# Patient Record
Sex: Male | Born: 2005 | Race: White | Hispanic: No | Marital: Single | Smoking: Never smoker
Health system: Southern US, Community
[De-identification: ages and names within clinical notes are randomized; demographics above are authoritative.]

---
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
## 2023-10-21 ENCOUNTER — Ambulatory Visit
Admission: EM | Admit: 2023-10-21 | Discharge: 2023-10-21 | Disposition: A | Discharge status: Home / Self Care | Source: Ambulatory Visit | Attending: Family Medicine | Admitting: Family Medicine

## 2023-10-21 DIAGNOSIS — H109 Unspecified conjunctivitis: Secondary | ICD-10-CM

## 2023-10-21 MED ORDER — TOBRAMYCIN 0.3 % OP SOLN: 1.0000 [drp] | OPHTHALMIC | 0 refills | Status: AC

## 2023-10-21 NOTE — ED Triage Notes (Signed)
 Patient presents to the office for right eye irritation an redness x 3 days. Denies any other symptoms.

## 2023-10-21 NOTE — ED Provider Notes (Signed)
  [facility]  Note:  This document was prepared using Conservation officer, historic buildings and may include unintentional dictation errors.  MRN: [id number] DOB: 2005-05-25  Subjective:   Christian Barker is a 18 y.o. male presenting for 2-day history of persistent right eye irritation, redness and slight watering.  No trauma, photophobia, loss of vision, vision change, contact lens use, eyelid pain/swelling/redness, fever.  Patient did spend a lot of time out fishing and in the sun over the weekend.  No other known inciting factor.  No current facility-administered medications for this encounter. No current outpatient medications on file.   No Known Allergies  History reviewed. No pertinent past medical history.   History reviewed. No pertinent surgical history.  History reviewed. No pertinent family history.  Social History   Tobacco Use   Smoking status: Never   Smokeless tobacco: Never    ROS   Objective:   Vitals: BP 120/85 (BP Location: Left Arm)   Pulse 63   Temp 97.7 F (36.5 C) (Oral)   Resp 18   SpO2 98%   Physical Exam Constitutional:      General: He is not in acute distress.    Appearance: Normal appearance. He is well-developed and normal weight. He is not ill-appearing, toxic-appearing or diaphoretic.  HENT:     Head: Normocephalic and atraumatic.     Right Ear: External ear normal.     Left Ear: External ear normal.     Nose: Nose normal.     Mouth/Throat:     Pharynx: Oropharynx is clear.  Eyes:     General: Lids are everted, no foreign bodies appreciated. No scleral icterus.       Right eye: No foreign body, discharge or hordeolum.        Left eye: No foreign body, discharge or hordeolum.     Extraocular Movements: Extraocular movements intact.     Conjunctiva/sclera:     Right eye: Right conjunctiva is injected (with slight matting at the base of the eyelashes). No chemosis, exudate or hemorrhage.    Left eye: Left  conjunctiva is not injected. No chemosis, exudate or hemorrhage. Cardiovascular:     Rate and Rhythm: Normal rate.  Pulmonary:     Effort: Pulmonary effort is normal.  Musculoskeletal:     Cervical back: Normal range of motion.  Neurological:     Mental Status: He is alert and oriented to person, place, and time.  Psychiatric:        Mood and Affect: Mood normal.        Behavior: Behavior normal.        Thought Content: Thought content normal.        Judgment: Judgment normal.     Assessment and Plan :   PDMP not reviewed this encounter.  1. Bacterial conjunctivitis of right eye    Will cover for infectious process with tobramycin .  Counseled patient on potential for adverse effects with medications prescribed/recommended today, ER and return-to-clinic precautions discussed, patient verbalized understanding.    Christopher Savannah, PA-C 10/21/23 1253

## 2023-10-23 DIAGNOSIS — H1031 Unspecified acute conjunctivitis, right eye: Secondary | ICD-10-CM | POA: Diagnosis not present
